# Patient Record
Sex: Female | Born: 1960 | Race: White | Hispanic: No | Smoking: Current every day smoker
Health system: Southern US, Community
[De-identification: ages and names within clinical notes are randomized; demographics above are authoritative.]

## PROBLEM LIST (undated history)

## (undated) DIAGNOSIS — F419 Anxiety disorder, unspecified: Secondary | ICD-10-CM

## (undated) DIAGNOSIS — F32A Depression, unspecified: Secondary | ICD-10-CM

## (undated) DIAGNOSIS — F329 Major depressive disorder, single episode, unspecified: Secondary | ICD-10-CM

---

## 2005-11-28 ENCOUNTER — Ambulatory Visit: Payer: Self-pay | Admitting: General Surgery

## 2006-04-11 ENCOUNTER — Ambulatory Visit: Payer: Self-pay | Admitting: General Surgery

## 2006-07-14 ENCOUNTER — Ambulatory Visit: Payer: Self-pay | Admitting: General Surgery

## 2006-11-10 ENCOUNTER — Ambulatory Visit: Payer: Self-pay | Admitting: General Surgery

## 2007-11-26 ENCOUNTER — Ambulatory Visit: Payer: Self-pay | Admitting: General Surgery

## 2008-11-30 ENCOUNTER — Ambulatory Visit: Payer: Self-pay | Admitting: Family Medicine

## 2009-09-24 ENCOUNTER — Emergency Department: Payer: Self-pay | Admitting: Emergency Medicine

## 2009-12-12 ENCOUNTER — Ambulatory Visit: Payer: Self-pay | Admitting: General Surgery

## 2010-12-09 HISTORY — PX: BREAST BIOPSY: SHX20

## 2010-12-13 ENCOUNTER — Ambulatory Visit: Payer: Self-pay | Admitting: General Surgery

## 2010-12-20 ENCOUNTER — Ambulatory Visit: Payer: Self-pay | Admitting: General Surgery

## 2010-12-31 ENCOUNTER — Ambulatory Visit: Payer: Self-pay | Admitting: General Surgery

## 2011-06-19 ENCOUNTER — Ambulatory Visit: Payer: Self-pay | Admitting: General Surgery

## 2011-12-16 ENCOUNTER — Ambulatory Visit: Payer: Self-pay | Admitting: General Surgery

## 2013-05-05 ENCOUNTER — Ambulatory Visit: Payer: Self-pay | Admitting: Family Medicine

## 2013-05-07 ENCOUNTER — Ambulatory Visit: Payer: Self-pay | Admitting: Family Medicine

## 2013-10-03 ENCOUNTER — Emergency Department: Payer: Self-pay | Admitting: Emergency Medicine

## 2013-11-19 ENCOUNTER — Ambulatory Visit: Payer: Self-pay | Admitting: Family Medicine

## 2014-02-14 IMAGING — US ULTRASOUND RIGHT BREAST
1 series · 8 of 8 positions shown · non-contrast
Comparison: 05/05/2013, 05/07/2013, 12/16/2011, 12/13/2010,
12/12/2009, 11/30/2008

CLINICAL DATA: Six-month re-evaluation of right breast asymmetry
and right breast cysts

EXAM:
DIGITAL DIAGNOSTIC  RIGHT MAMMOGRAM WITH CAD
ULTRASOUND RIGHT BREAST

[Series 1: ultrasound right breast · 0.08mm/px · 8 of 8 slices shown]
[im 1/8]
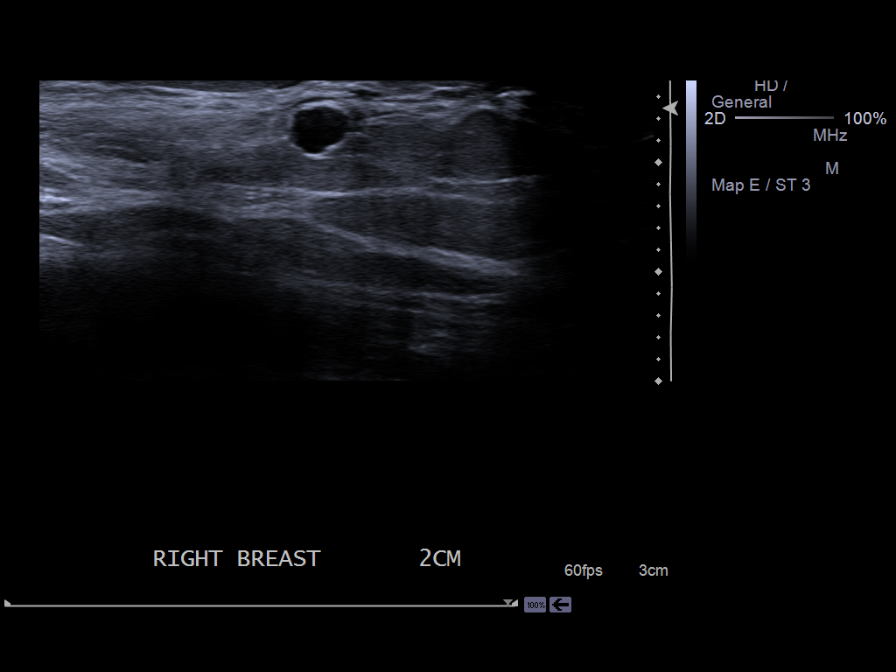
[im 2/8]
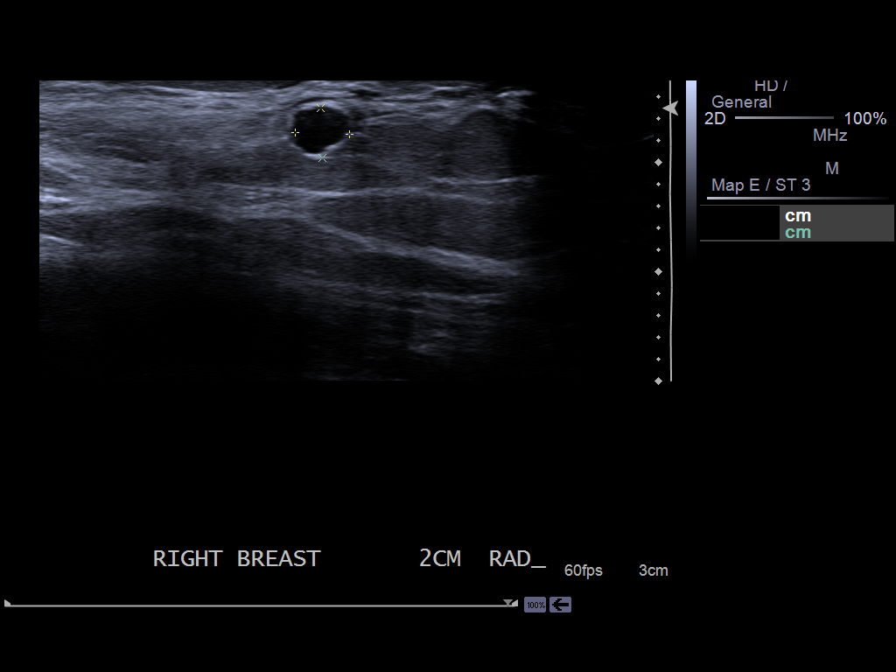
[im 3/8]
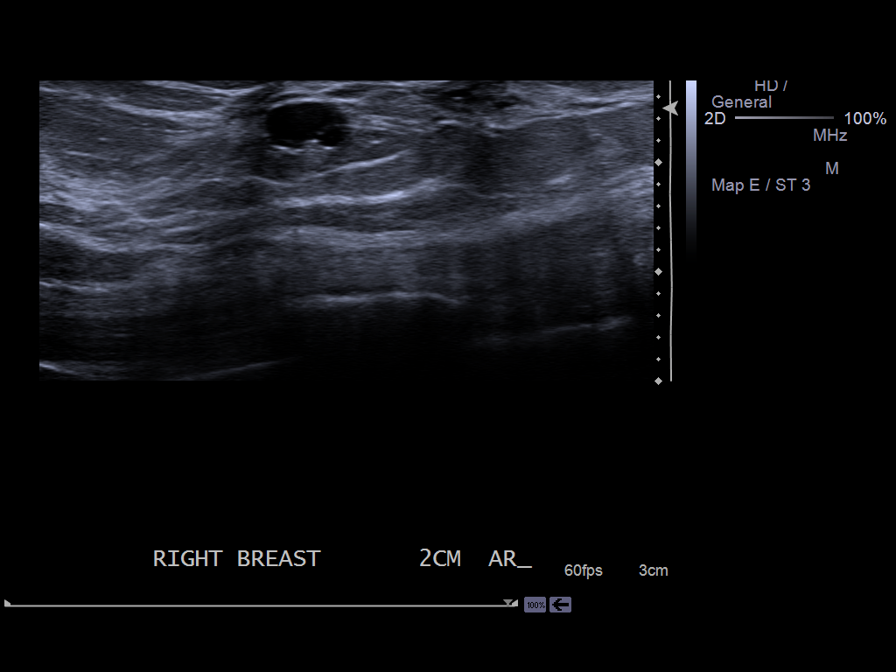
[im 4/8]
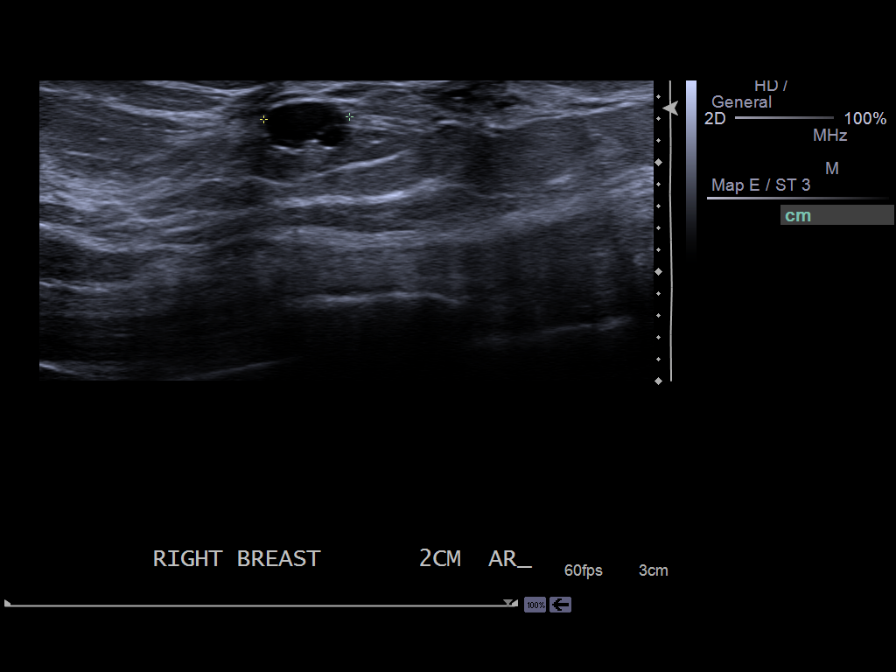
[im 5/8]
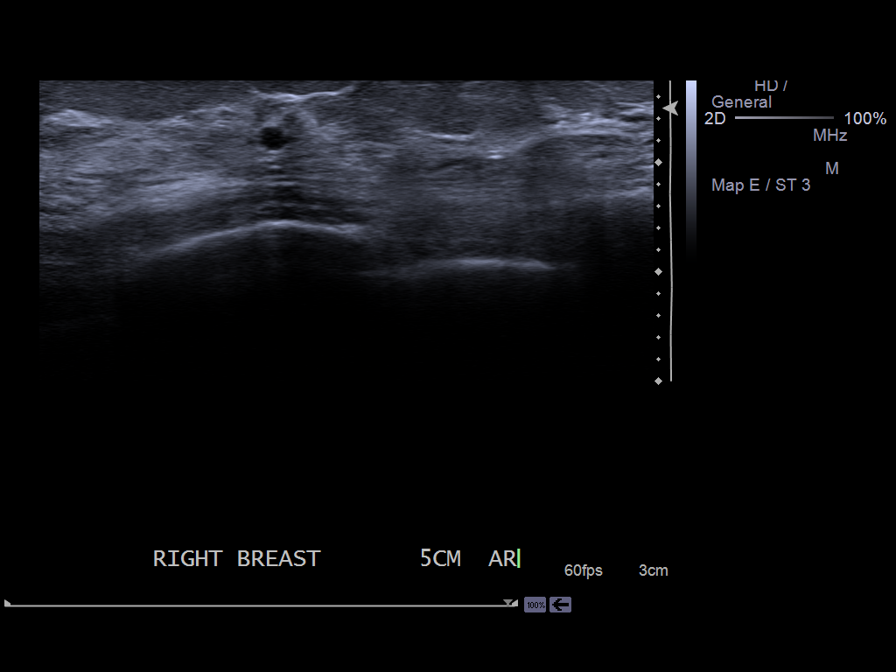
[im 6/8]
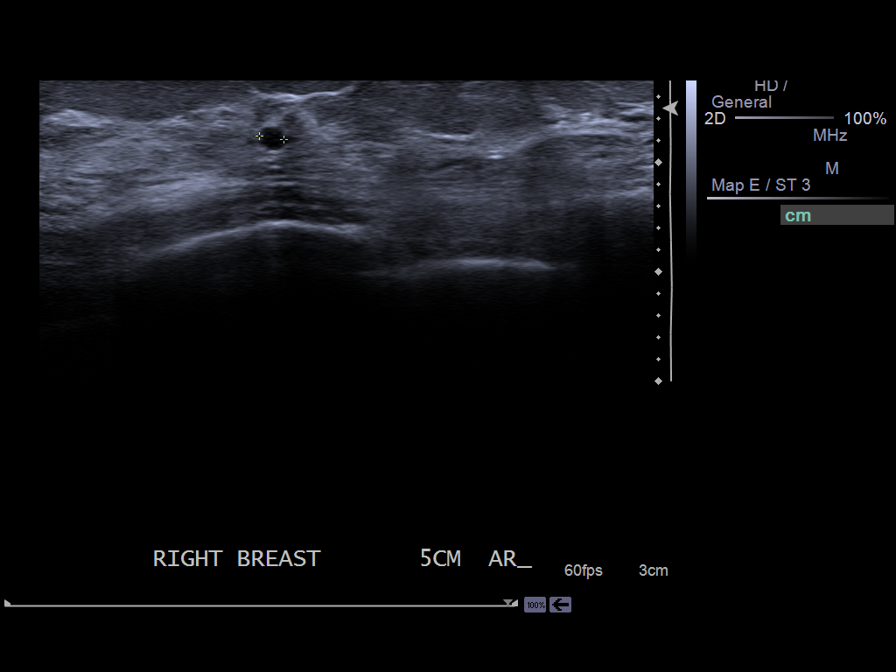
[im 7/8]
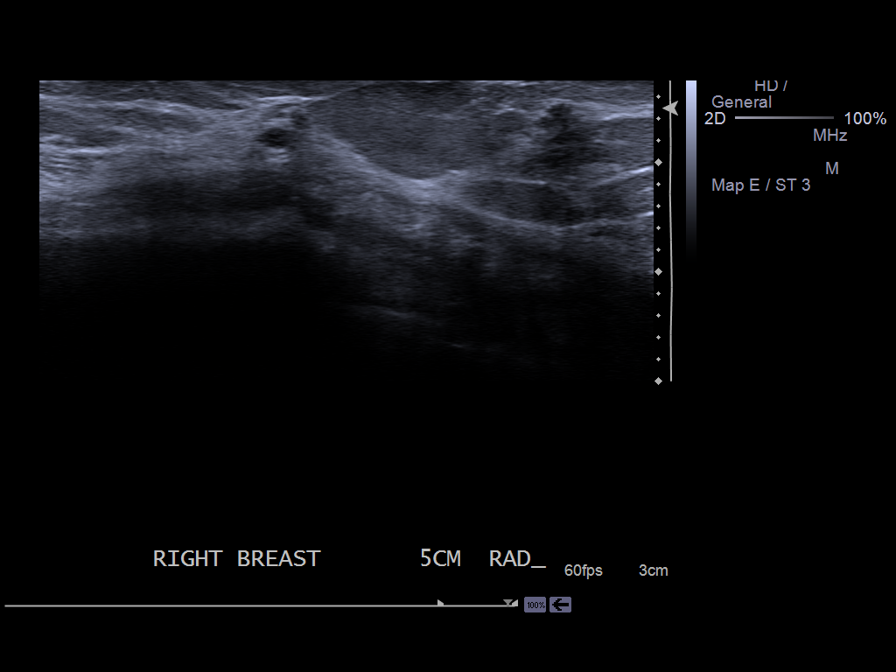
[im 8/8]
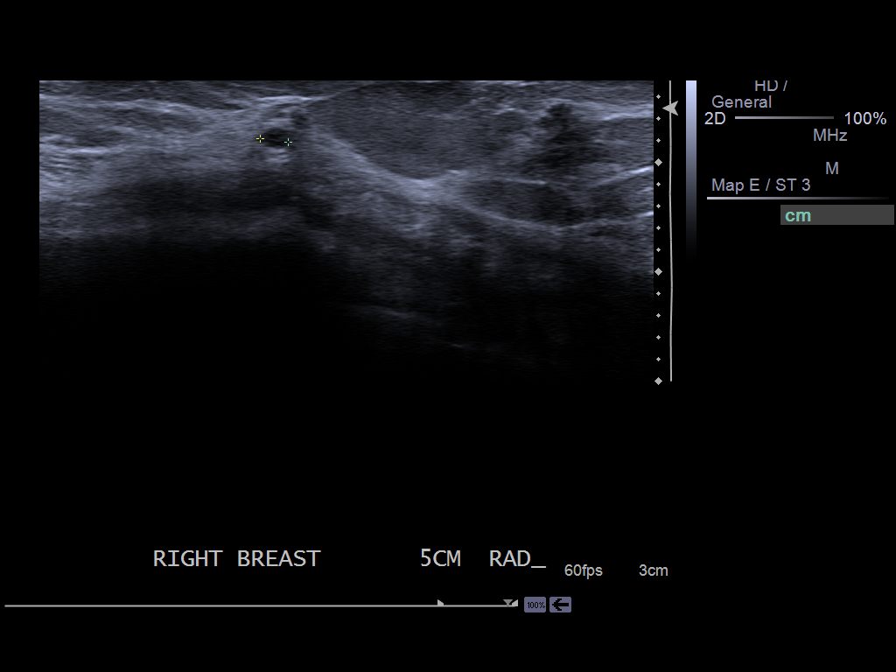

[8 of 8 positions shown; findings below may reference images not displayed]

ACR Breast Density Category b: There are scattered areas of
fibroglandular density.
FINDINGS: There are no mammographic abnormalities. The previously questioned
mammographic asymmetry in the posterior central right breast seen on
the MLO view is stable on all prior studies and represents normal
glandular tissue.

Mammographic images were processed with CAD.

On physical exam,there are no palpable abnormalities..

Ultrasound is performed, showing a mildly complicated cyst in the 9
o'clock position of the right breast 2 cm from the nipple. It
measures 5 x 5 x 8 mm. There is also a minimally complicated cyst in
the 8 o'clock position 5 cm from the nipple. It measures 3 mm in
diameter..
IMPRESSION: The mammogram is normal. There are 2 mildly complicated cysts seen
by ultrasound which are stable from the prior study April 2013.

RECOMMENDATION:
Diagnostic bilateral mammogram with right ultrasound in 6 months

I have discussed the findings and recommendations with the patient.
Results were also provided in writing at the conclusion of the
visit.

BI-RADS CATEGORY  3: Probably benign finding(s) - short interval
follow-up suggested.

## 2016-03-19 ENCOUNTER — Encounter: Payer: Self-pay | Admitting: Emergency Medicine

## 2016-03-19 ENCOUNTER — Emergency Department
Admission: EM | Admit: 2016-03-19 | Discharge: 2016-03-19 | Disposition: A | Discharge status: Home / Self Care | Payer: BC Managed Care – PPO | Attending: Emergency Medicine | Admitting: Emergency Medicine

## 2016-03-19 ENCOUNTER — Emergency Department: Payer: BC Managed Care – PPO

## 2016-03-19 DIAGNOSIS — R413 Other amnesia: Secondary | ICD-10-CM | POA: Diagnosis present

## 2016-03-19 DIAGNOSIS — F4325 Adjustment disorder with mixed disturbance of emotions and conduct: Secondary | ICD-10-CM

## 2016-03-19 DIAGNOSIS — F321 Major depressive disorder, single episode, moderate: Secondary | ICD-10-CM

## 2016-03-19 DIAGNOSIS — F419 Anxiety disorder, unspecified: Secondary | ICD-10-CM | POA: Diagnosis not present

## 2016-03-19 DIAGNOSIS — F17200 Nicotine dependence, unspecified, uncomplicated: Secondary | ICD-10-CM | POA: Insufficient documentation

## 2016-03-19 DIAGNOSIS — F329 Major depressive disorder, single episode, unspecified: Secondary | ICD-10-CM | POA: Insufficient documentation

## 2016-03-19 DIAGNOSIS — I10 Essential (primary) hypertension: Secondary | ICD-10-CM | POA: Insufficient documentation

## 2016-03-19 DIAGNOSIS — F32A Depression, unspecified: Secondary | ICD-10-CM

## 2016-03-19 HISTORY — DX: Major depressive disorder, single episode, unspecified: F32.9

## 2016-03-19 HISTORY — DX: Anxiety disorder, unspecified: F41.9

## 2016-03-19 HISTORY — DX: Depression, unspecified: F32.A

## 2016-03-19 LAB — URINALYSIS COMPLETE WITH MICROSCOPIC (ARMC ONLY)
BACTERIA UA: NONE SEEN
Bilirubin Urine: NEGATIVE
Glucose, UA: NEGATIVE mg/dL
Hgb urine dipstick: NEGATIVE
Leukocytes, UA: NEGATIVE
Nitrite: NEGATIVE
PH: 6 (ref 5.0–8.0)
PROTEIN: NEGATIVE mg/dL
Specific Gravity, Urine: 1.012 (ref 1.005–1.030)

## 2016-03-19 LAB — COMPREHENSIVE METABOLIC PANEL
ALBUMIN: 4.8 g/dL (ref 3.5–5.0)
ALK PHOS: 123 U/L (ref 38–126)
ALT: 17 U/L (ref 14–54)
ANION GAP: 7 (ref 5–15)
AST: 25 U/L (ref 15–41)
BUN: 13 mg/dL (ref 6–20)
CHLORIDE: 104 mmol/L (ref 101–111)
CO2: 27 mmol/L (ref 22–32)
Calcium: 10 mg/dL (ref 8.9–10.3)
Creatinine, Ser: 0.8 mg/dL (ref 0.44–1.00)
GFR calc non Af Amer: >60 mL/min (ref >60)
Glucose, Bld: 100 mg/dL — ABNORMAL HIGH (ref 65–99)
POTASSIUM: 3.2 mmol/L — AB (ref 3.5–5.1)
SODIUM: 138 mmol/L (ref 135–145)
Total Bilirubin: 0.8 mg/dL (ref 0.3–1.2)
Total Protein: 8.6 g/dL — ABNORMAL HIGH (ref 6.5–8.1)

## 2016-03-19 LAB — CBC
HCT: 44.5 % (ref 35.0–47.0)
HEMOGLOBIN: 15 g/dL (ref 12.0–16.0)
MCH: 29.6 pg (ref 26.0–34.0)
MCHC: 33.6 g/dL (ref 32.0–36.0)
MCV: 87.9 fL (ref 80.0–100.0)
Platelets: 236 10*3/uL (ref 150–440)
RBC: 5.07 MIL/uL (ref 3.80–5.20)
RDW: 13.4 % (ref 11.5–14.5)
WBC: 9.5 10*3/uL (ref 3.6–11.0)

## 2016-03-19 MED ORDER — ONDANSETRON 4 MG PO TBDP
4.0000 mg | ORAL_TABLET | Freq: Once | ORAL | Status: AC
  Administered 2016-03-19: 4 mg via ORAL
  Filled 2016-03-19: qty 1

## 2016-03-19 NOTE — ED Notes (Signed)
Pt reports she has been having issues with her memory and small skills over past couple weeks. Pt was seen by PCP yesterday and placed on namenda, pt concerned bc she couldn't get appt with neuro until may 16th.

## 2016-03-19 NOTE — ED Provider Notes (Signed)
[facility] Emergency Department Provider Note  ____________________________________________  Time seen: Approximately 12:46 PM  I have reviewed the triage vital signs and the nursing notes.   HISTORY  Chief Complaint Memory Loss    HPI Alicia Norton is a 55 y.o. female with no significant reported past medical history who presents by private vehicle with chief complaint of worsening memory and cognitive deficits.  She presents with a friend with whom she works.  The friend states that she and the patient's other coworkers noticed for about a year now little things from time to time to indicate that the patient is having some memory difficulties.  However she said it has been markedly worse acutely over the last 3 or 4 days, definitely less than a week.  The patient forgets how to do simple tasks or tasks that she has been doing for years, such as using the copy machine or phone system.  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  As described above, the acute worsening of the symptoms has made them severe and nothing is making it better or worse.  She was taken to her PCP yesterday and started on Namenda and referred to neurology, but her appointment will not be for another 5 weeks.  She is tearful and extremely worried about what this represents.  He denies generalized weakness, headache, visual changes, difficulty swallowing, difficulty speaking, chest pain, shortness of breath, nausea, vomiting, diarrhea, dysuria, difficulty with ambulation, and any weakness or numbness in any particular extremity.  The patient does have a history of depression and anxiety.  She reports that her oldest son has been living with her recently and that he is "not a nice person", very mean and angry, and that  she has been under a great deal of increased stress as a result of the living situation.   Past Medical History  Diagnosis Date  . Depression   . Anxiety     Patient Active Problem List   Diagnosis Date Noted  . 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 Moderate major depression (HCC) 03/19/2016  . Hypertension 03/19/2016    History reviewed. No pertinent past surgical history.  Current Outpatient Rx  Name  Route  Sig  Dispense  Refill  . amLODipine (NORVASC) 10 MG tablet   Oral   Take 10 mg by mouth daily.         . clonazePAM (KLONOPIN) 0.5 MG tablet   Oral   Take 0.5 mg by mouth daily as needed for anxiety.         . meloxicam (MOBIC) 15 MG tablet   Oral   Take 15 mg by mouth daily.         Marland Kitchen PARoxetine (PAXIL) 20 MG tablet   Oral   Take 20 mg by mouth daily.         Marland Kitchen triamcinolone cream (KENALOG) 0.1 %   Topical   Apply 1 application topically 2 (two) times daily as needed (for itching).           Allergies Review of patient's allergies indicates no known allergies.  History reviewed. No pertinent family history.  Social History Social History  Substance Use Topics  . Smoking status: Current Every Day Smoker  . Smokeless tobacco: None  .  Alcohol Use: No    Review of Systems Constitutional: No fever/chills Eyes: No visual changes. ENT: No sore throat. Cardiovascular: Denies chest pain. Respiratory: Denies shortness of breath. Gastrointestinal: No abdominal pain.  No nausea, no vomiting.  No diarrhea.  No constipation. Genitourinary: Negative for dysuria. Musculoskeletal: Negative for back pain. Skin: Negative for rash. Neurological: Negative for headaches, focal weakness or numbness.  Difficulty remembering things including both details of appointments or events as well as how to perform simple tasks or even write her own name.  10-point ROS otherwise  negative.  ____________________________________________   PHYSICAL EXAM:  VITAL SIGNS: ED Triage Vitals  Enc Vitals Group     BP 03/19/16 1214 180/119 mmHg     Pulse Rate 03/19/16 1214 68     Resp 03/19/16 1214 18     Temp 03/19/16 1214 98.7 F (37.1 C)     Temp Source 03/19/16 1214 Oral     SpO2 03/19/16 1214 98 %     Weight 03/19/16 1214 120 lb (54.432 kg)     Height 03/19/16 1214 5\' 6"  (1.676 m)     Head Cir --      Peak Flow --      Pain Score --      Pain Loc --      Pain Edu? --      Excl. in GC? --     Constitutional: Alert and oriented. Appears older than chronological age.  Tearful and upset. Eyes: Conjunctivae are normal. PERRL. EOMI. Head: Atraumatic. Nose: No congestion/rhinnorhea. Mouth/Throat: Mucous membranes are moist.  Oropharynx non-erythematous. Neck: No stridor.  No meningeal signs.   Cardiovascular: Normal rate, regular rhythm. Good peripheral circulation. Grossly normal heart sounds.   Respiratory: Normal respiratory effort.  No retractions. Lungs CTAB. Gastrointestinal: Soft and nontender. No distention.  Musculoskeletal: No lower extremity tenderness nor edema. No gross deformities of extremities. Neurologic:  Normal speech and language. No gross focal neurologic deficits are appreciated.  Skin:  Skin is warm, dry and intact. No rash noted. Psychiatric: Mood and affect are tearful, upset, emotional, but generally appropriate for the circumstances.  No SI/HI  ____________________________________________   LABS (all labs ordered are listed, but only abnormal results are displayed)  Labs Reviewed  COMPREHENSIVE METABOLIC PANEL - Abnormal; Notable for the following:    Potassium 3.2 (*)    Glucose, Bld 100 (*)    Total Protein 8.6 (*)    All other components within normal limits  URINALYSIS COMPLETEWITH MICROSCOPIC ([facility] ONLY) - Abnormal; Notable for the following:    Color, Urine YELLOW (*)    APPearance CLEAR (*)    Ketones, ur TRACE (*)     Squamous Epithelial / LPF 0-5 (*)    All other components within normal limits  CBC  CBG MONITORING, ED   ____________________________________________  EKG  ED ECG REPORT I, Janaysha Depaulo, the attending physician, personally viewed and interpreted this ECG.   Date: 03/19/2016  EKG Time: 13:44  Rate: 68  Rhythm: normal sinus rhythm  Axis: Normal  Intervals:Normal  ST&T Change: Non-specific ST segment / T-wave changes, but no evidence of acute ischemia.   ____________________________________________  RADIOLOGY   Ct Head Wo Contrast  03/19/2016  CLINICAL DATA:  Worsening memory and cognitive deficits. EXAM: CT HEAD WITHOUT CONTRAST TECHNIQUE: Contiguous axial images were obtained from the base of the skull through the vertex without intravenous contrast. COMPARISON:  None. FINDINGS: Bony calvarium appears intact. No mass effect or midline shift is noted. Ventricular  size is within normal limits. There is no evidence of mass lesion, hemorrhage or acute infarction. IMPRESSION: Normal head CT. Electronically Signed   By: Lupita Raider, M.D.   On: 03/19/2016 13:56    ____________________________________________   PROCEDURES  Procedure(s) performed: None  Critical Care performed: No ____________________________________________   INITIAL IMPRESSION / ASSESSMENT AND PLAN / ED COURSE  Pertinent labs & imaging results that were available during my care of the patient were reviewed by me and considered in my medical decision making (see chart for details).  I will proceed with lab work and urinalysis as well as a screening head CT noncontrast to evaluate for any possible lesion or mass effect or even evidence of a recent stroke.  The patient has no current focal neurological deficits and I do not think an emergent MRI would be beneficial.    I discussed with the patient and her friend they have possible benefit of speaking with psychiatry all we are also evaluating for  possible organic causes.  She was amenable to this.  I called by phone and spoke with Dr. Toni Amend and discussed the case with him.  He agreed to come to the emergency department and personally evaluated the patient as well.    ----------------------------------------- 4:20 PM on 03/19/2016 -----------------------------------------  Dr. Toni Amend evaluated the patient in the emergency department and discussed the case with me in person.  He strongly feels that the patient does not have any sort of early-onset dementia and that the symptoms are all the result of anxiety and depression.  She let him know that she has not been taking her Paxil or Klonopin for a long period of time.  He counseled her to continue taking her medications as prescribed and to follow up with her primary care doctor as well as with any possible employee assistance program.  He encouraged her to stop taking the Namenda.  I discussed all this with her as well and reiterated the need for outpatient follow-up.  She is very comfortable with this plan. ____________________________________________  FINAL CLINICAL IMPRESSION(S) / ED DIAGNOSES  Final diagnoses:  Anxiety  Depression      NEW MEDICATIONS STARTED DURING THIS VISIT:  New Prescriptions   No medications on file      Note:  This document was prepared using Dragon voice recognition software and may include unintentional dictation errors.   Loleta Rose, MD 03/19/16 [phone]

## 2016-03-19 NOTE — ED Notes (Signed)
MD at bedside. 

## 2016-03-19 NOTE — ED Notes (Signed)
Pt is concerned about her behavior and memory issues. Pt counseled that she is not herself - "one day I am fine and the next day I am not." She is also concerned that her PCP just gave her medication and didn't do any tests. She has neuro appt May 16 but is very anxious and doesn't feel like she can wait. Pt tearful in triage and says she has been crying for 3 days.
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
## 2016-03-19 NOTE — ED Notes (Signed)
Patient transported to CT 

## 2016-03-19 NOTE — Discharge Instructions (Signed)
As we discussed and you discussed with Dr. Toni Amend, he strongly feels that her symptoms are the result of anxiety and depression, not early-onset dementia.  He recommended that you stop taking the Namenda and follow-up with the employee assistance program at your place of employment.  I also provided information for [facility] which is a psychiatric health care Center that has office hours and a crisis hotline number.  If your symptoms worsen, if he develop any thoughts of hurting herself or anyone else, or you develop any other emergent medical condition, please return immediately to the emergency department.   Major Depressive Disorder Major depressive disorder is a mental illness. It also may be called clinical depression or unipolar depression. Major depressive disorder usually causes feelings of sadness, hopelessness, or helplessness. Some people with this disorder do not feel particularly sad but lose interest in doing things they used to enjoy (anhedonia). Major depressive disorder also can cause physical symptoms. It can interfere with work, school, relationships, and other normal everyday activities. The disorder varies in severity but is longer lasting and more serious than the sadness we all feel from time to time in our lives. Major depressive disorder often is triggered by stressful life events or major life changes. Examples of these triggers include divorce, loss of your job or home, a move, and the death of a family member or close friend. Sometimes this disorder occurs for no obvious reason at all. People who have family members with major depressive disorder or bipolar disorder are at higher risk for developing this disorder, with or without life stressors. Major depressive disorder can occur at any age. It may occur just once in your life (single episode major depressive disorder). It may occur multiple times (recurrent major depressive disorder). SYMPTOMS People with major depressive disorder  have either anhedonia or depressed mood on nearly a daily basis for at least 2 weeks or longer. Symptoms of depressed mood include:  Feelings of sadness (blue or down in the dumps) or emptiness.  Feelings of hopelessness or helplessness.  Tearfulness or episodes of crying (may be observed by others).  Irritability (children and adolescents). In addition to depressed mood or anhedonia or both, people with this disorder have at least four of the following symptoms:  Difficulty sleeping or sleeping too much.   Significant change (increase or decrease) in appetite or weight.   Lack of energy or motivation.  Feelings of guilt and worthlessness.   Difficulty concentrating, remembering, or making decisions.  Unusually slow movement (psychomotor retardation) or restlessness (as observed by others).   Recurrent wishes for death, recurrent thoughts of self-harm (suicide), or a suicide attempt. People with major depressive disorder commonly have persistent negative thoughts about themselves, other people, and the world. People with severe major depressive disorder may experiencedistorted beliefs or perceptions about the world (psychotic delusions). They also may see or hear things that are not real (psychotic hallucinations). DIAGNOSIS Major depressive disorder is diagnosed through an assessment by your health care provider. Your health care provider will ask aboutaspects of your daily life, such as mood,sleep, and appetite, to see if you have the diagnostic symptoms of major depressive disorder. Your health care provider may ask about your medical history and use of alcohol or drugs, including prescription medicines. Your health care provider also may do a physical exam and blood work. This is because certain medical conditions and the use of certain substances can cause major depressive disorder-like symptoms (secondary depression). Your health care provider also may refer  you to a mental  health specialist for further evaluation and treatment. TREATMENT It is important to recognize the symptoms of major depressive disorder and seek treatment. The following treatments can be prescribed for this disorder:   Medicine. Antidepressant medicines usually are prescribed. Antidepressant medicines are thought to correct chemical imbalances in the brain that are commonly associated with major depressive disorder. Other types of medicine may be added if the symptoms do not respond to antidepressant medicines alone or if psychotic delusions or hallucinations occur.  Talk therapy. Talk therapy can be helpful in treating major depressive disorder by providing support, education, and guidance. Certain types of talk therapy also can help with negative thinking (cognitive behavioral therapy) and with relationship issues that trigger this disorder (interpersonal therapy). A mental health specialist can help determine which treatment is best for you. Most people with major depressive disorder do well with a combination of medicine and talk therapy. Treatments involving electrical stimulation of the brain can be used in situations with extremely severe symptoms or when medicine and talk therapy do not work over time. These treatments include electroconvulsive therapy, transcranial magnetic stimulation, and vagal nerve stimulation.   This information is not intended to replace advice given to you by your health care provider. Make sure you discuss any questions you have with your health care provider.   Document Released: 03/22/2013 Document Revised: 12/16/2014 Document Reviewed: 03/22/2013 Elsevier Interactive Patient Education 2016 [employer] Inc.  Generalized Anxiety Disorder Generalized anxiety disorder (GAD) is a mental disorder. It interferes with life functions, including relationships, work, and school. GAD is different from normal anxiety, which everyone experiences at some point in their lives in  response to specific life events and activities. Normal anxiety actually helps [place] prepare for and get through these life events and activities. Normal anxiety goes away after the event or activity is over.  GAD causes anxiety that is not necessarily related to specific events or activities. It also causes excess anxiety in proportion to specific events or activities. The anxiety associated with GAD is also difficult to control. GAD can vary from mild to severe. People with severe GAD can have intense waves of anxiety with physical symptoms (panic attacks).  SYMPTOMS The anxiety and worry associated with GAD are difficult to control. This anxiety and worry are related to many life events and activities and also occur more days than not for 6 months or longer. People with GAD also have three or more of the following symptoms (one or more in children):  Restlessness.   Fatigue.  Difficulty concentrating.   Irritability.  Muscle tension.  Difficulty sleeping or unsatisfying sleep. DIAGNOSIS GAD is diagnosed through an assessment by your health care provider. Your health care provider will ask you questions aboutyour mood,physical symptoms, and events in your life. Your health care provider may ask you about your medical history and use of alcohol or drugs, including prescription medicines. Your health care provider may also do a physical exam and blood tests. Certain medical conditions and the use of certain substances can cause symptoms similar to those associated with GAD. Your health care provider may refer you to a mental health specialist for further evaluation. TREATMENT The following therapies are usually used to treat GAD:   Medication. Antidepressant medication usually is prescribed for long-term daily control. Antianxiety medicines may be added in severe cases, especially when panic attacks occur.   Talk therapy (psychotherapy). Certain types of talk therapy can be helpful in  treating GAD by providing support,  education, and guidance. A form of talk therapy called cognitive behavioral therapy can teach you healthy ways to think about and react to daily life events and activities.  Stress managementtechniques. These include yoga, meditation, and exercise and can be very helpful when they are practiced regularly. A mental health specialist can help determine which treatment is best for you. Some people see improvement with one therapy. However, other people require a combination of therapies.   This information is not intended to replace advice given to you by your health care provider. Make sure you discuss any questions you have with your health care provider.   Document Released: 03/22/2013 Document Revised: 12/16/2014 Document Reviewed: 03/22/2013 Elsevier Interactive Patient Education [employer]2016 Else[employer] Inc.

## 2016-04-23 ENCOUNTER — Other Ambulatory Visit: Payer: Self-pay | Admitting: Family Medicine

## 2016-04-23 DIAGNOSIS — R928 Other abnormal and inconclusive findings on diagnostic imaging of breast: Secondary | ICD-10-CM

## 2016-04-23 DIAGNOSIS — Z1231 Encounter for screening mammogram for malignant neoplasm of breast: Secondary | ICD-10-CM

## 2016-05-10 ENCOUNTER — Inpatient Hospital Stay: Admission: RE | Admit: 2016-05-10 | Payer: BC Managed Care – PPO | Source: Ambulatory Visit

## 2016-05-10 ENCOUNTER — Ambulatory Visit: Payer: BC Managed Care – PPO | Attending: Family Medicine

## 2016-05-10 ENCOUNTER — Other Ambulatory Visit: Payer: BC Managed Care – PPO

## 2016-08-28 ENCOUNTER — Ambulatory Visit
Admission: RE | Admit: 2016-08-28 | Discharge: 2016-08-28 | Disposition: A | Discharge status: Home / Self Care | Payer: BC Managed Care – PPO | Source: Ambulatory Visit | Attending: Family Medicine | Admitting: Family Medicine

## 2016-08-28 DIAGNOSIS — Z1231 Encounter for screening mammogram for malignant neoplasm of breast: Secondary | ICD-10-CM

## 2016-08-28 DIAGNOSIS — R928 Other abnormal and inconclusive findings on diagnostic imaging of breast: Secondary | ICD-10-CM

## 2017-09-10 IMAGING — US US BREAST*R* LIMITED INC AXILLA
1 series · 6 of 6 positions shown · non-contrast
Comparison: Previous exam(s).

CLINICAL DATA: Follow-up of probably benign findings in the right 8
and 9 o'clock breast from last mammogram in November 2013. Patient
did not return for the recommended six-month follow-up.

EXAM:
2D DIGITAL DIAGNOSTIC BILATERAL MAMMOGRAM WITH ADJUNCT TOMO
ULTRASOUND RIGHT BREAST

[Series 1: us breast*right* limited inc axilla · 0.04mm/px · 6 of 6 slices shown]
[im 1/6]
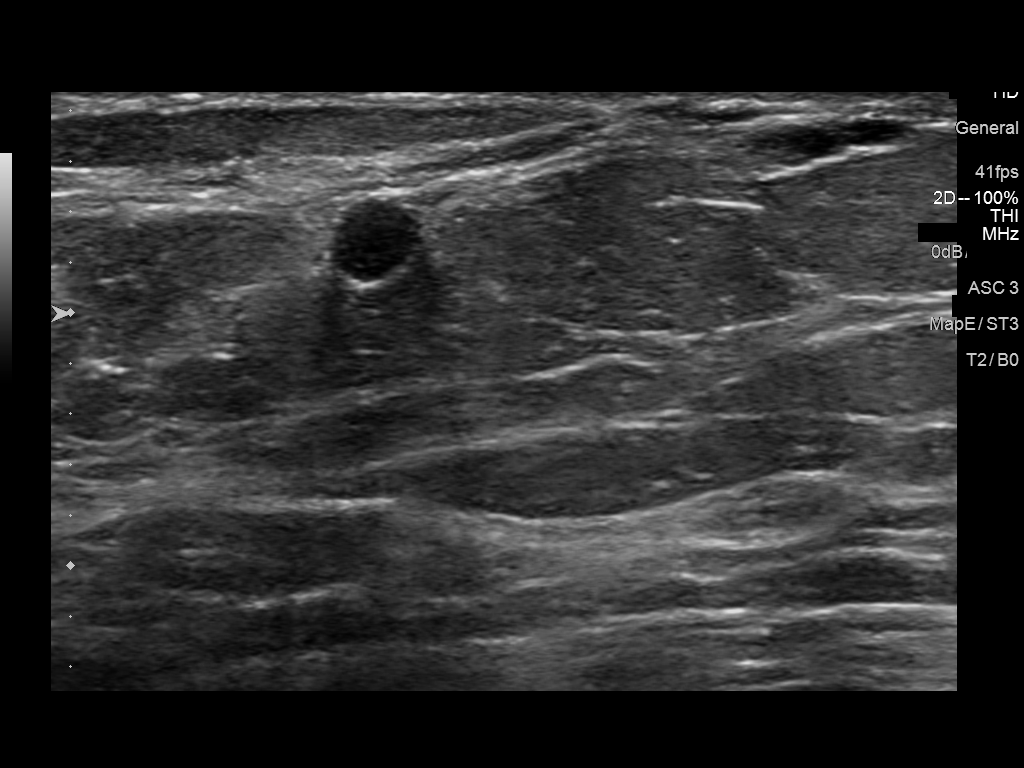
[im 2/6]
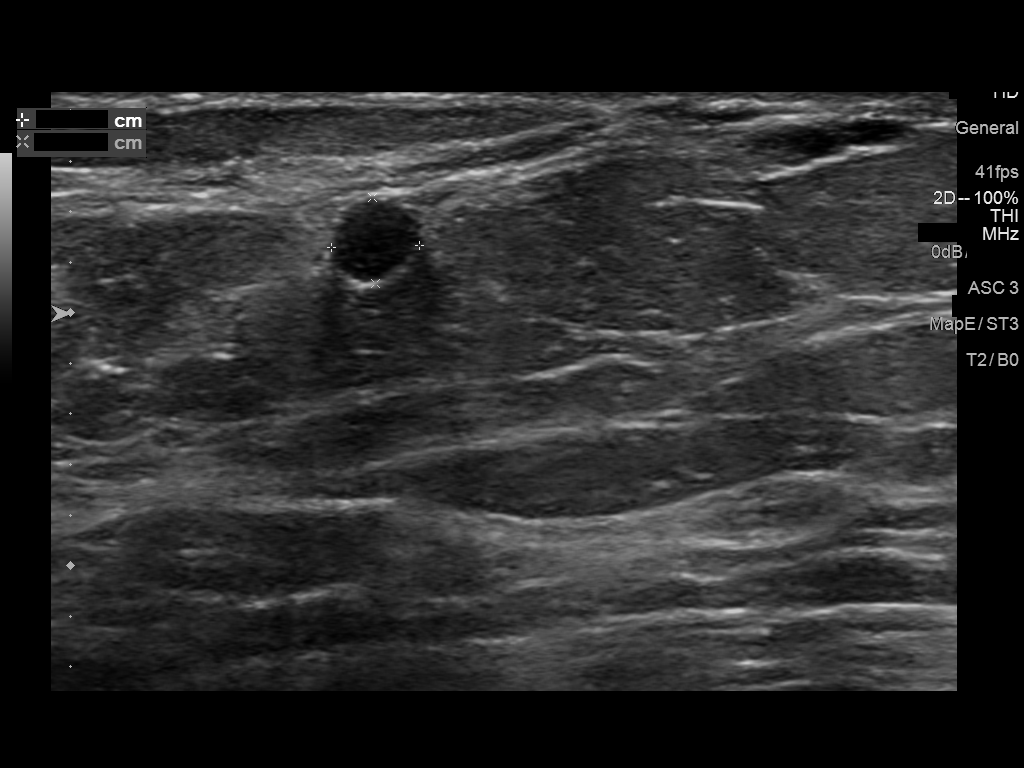
[im 3/6]
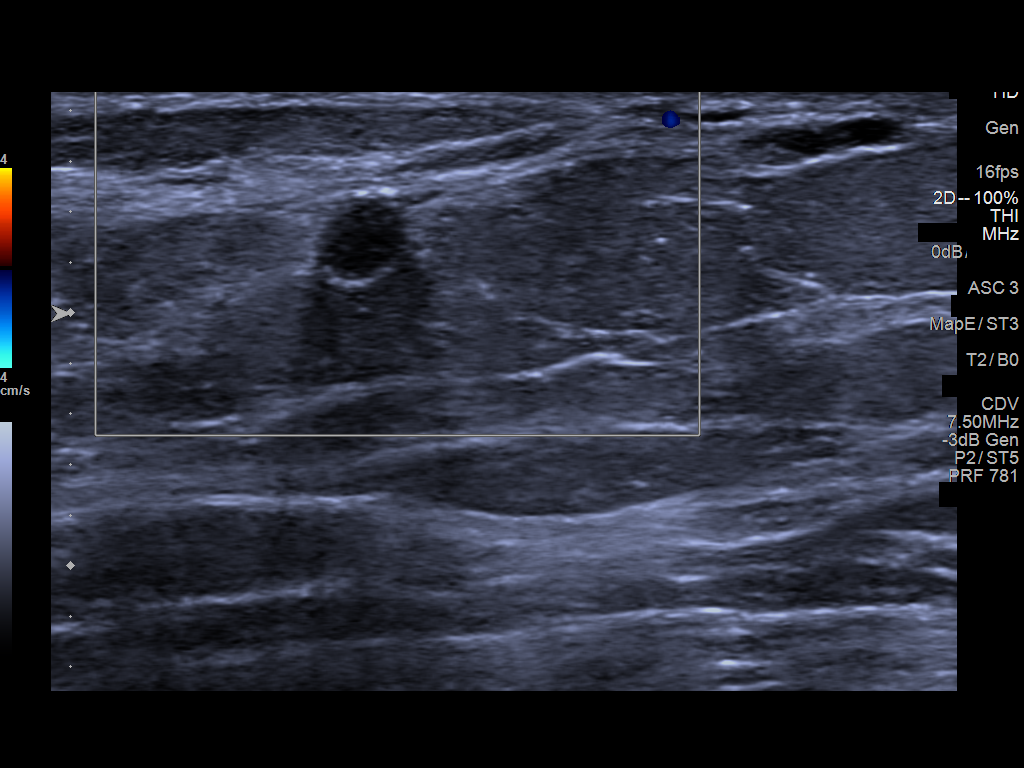
[im 4/6]
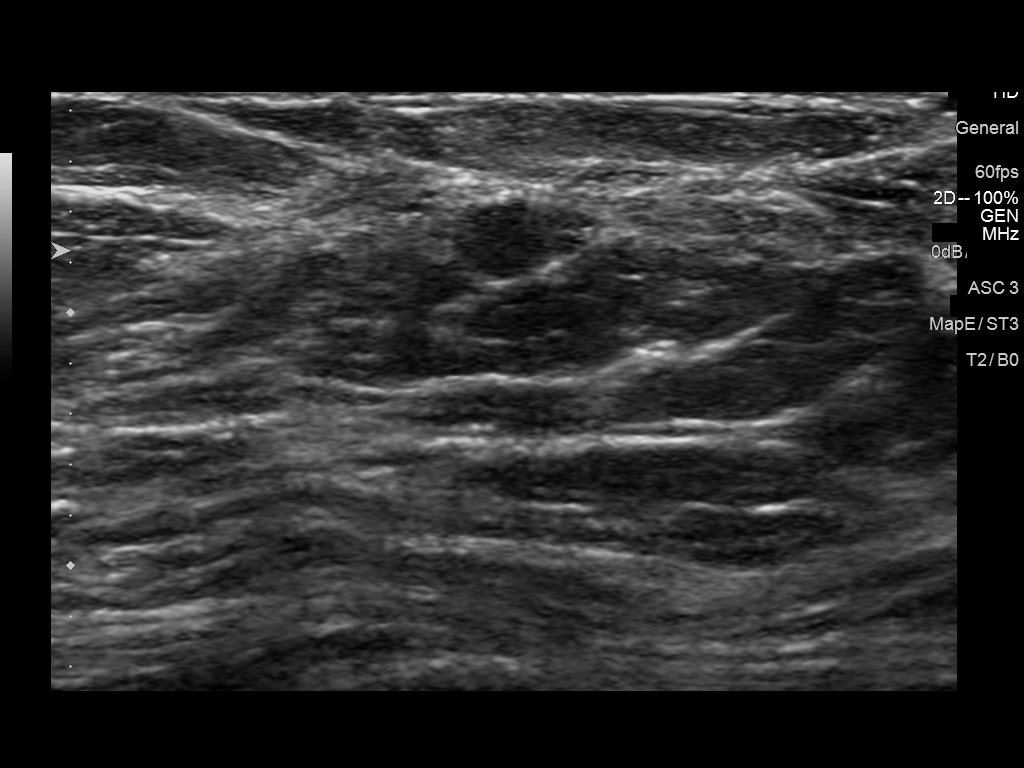
[im 5/6]
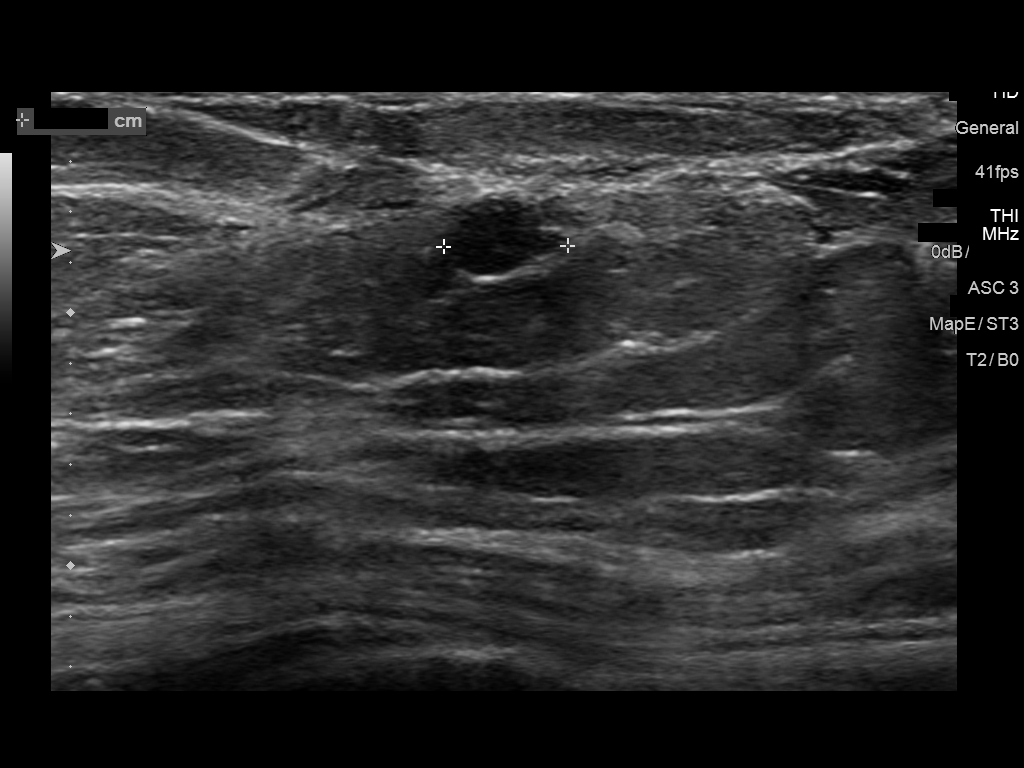
[im 6/6]
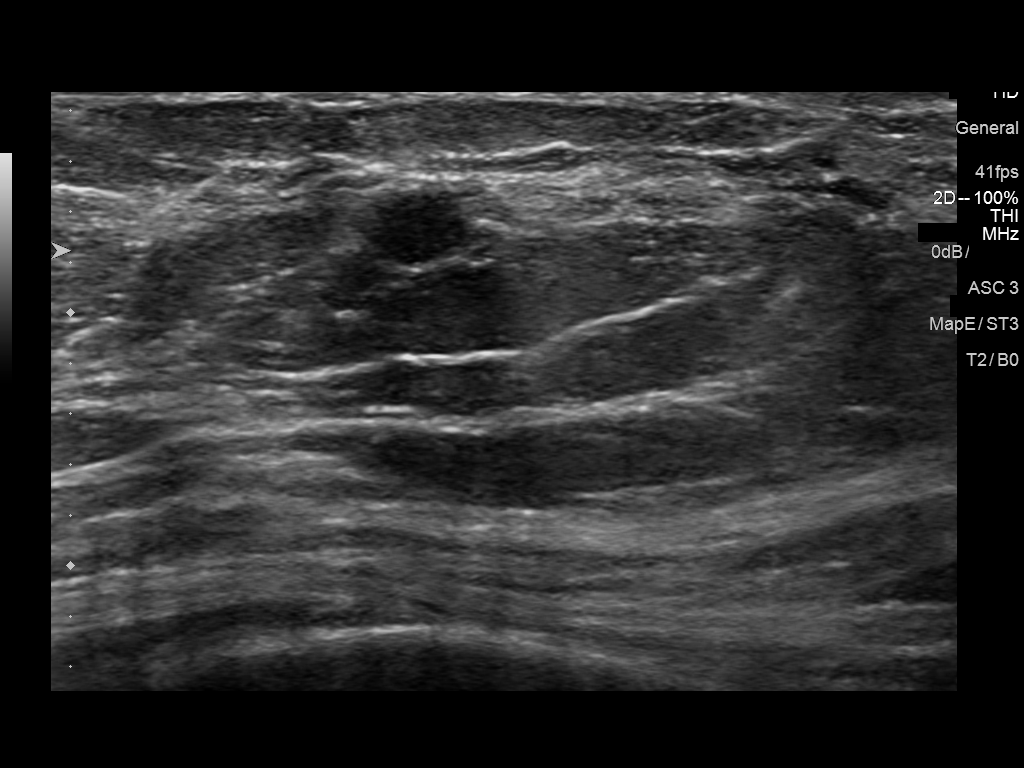

[6 of 6 positions shown; findings below may reference images not displayed]

ACR Breast Density Category b: There are scattered areas of
fibroglandular density.
FINDINGS: A mammographically, there are no suspicious masses, areas of
architectural distortion or microcalcifications in either breast.
The previously seen focal asymmetry in the right 9 o'clock breast,
anterior depth, is less prominent mammographically.

On physical exam, no suspicious masses are found.

Targeted ultrasound is performed, showing right breast 9 o'clock 2
cm from the nipple hypoechoic circumscribed benign-appearing nodule
measuring 0.5 by 0.4 x 0.3 cm. This nodule is slightly less
prominent than on the previous ultrasound dated 11/19/2013. The
previously seen right breast 8 o'clock 2 mm presumed complicated
cyst is no longer present.
IMPRESSION: Probably benign right breast 9 o'clock nodule, for which six-month
follow-up is recommended.

RECOMMENDATION:
Diagnostic mammogram and possibly ultrasound of the right breast in
6 months. (Code:[id number]-[id number])

I have discussed the findings and recommendations with the patient.
Results were also provided in writing at the conclusion of the
visit. If applicable, a reminder letter will be sent to the patient
regarding the next appointment.

BI-RADS CATEGORY  3: Probably benign.

## 2018-02-03 ENCOUNTER — Other Ambulatory Visit: Payer: Self-pay | Admitting: Neurology

## 2018-02-03 DIAGNOSIS — R4189 Other symptoms and signs involving cognitive functions and awareness: Secondary | ICD-10-CM

## 2018-02-12 ENCOUNTER — Ambulatory Visit
Admission: RE | Admit: 2018-02-12 | Discharge: 2018-02-12 | Disposition: A | Discharge status: Home / Self Care | Payer: BC Managed Care – PPO | Source: Ambulatory Visit | Attending: Neurology | Admitting: Neurology

## 2018-02-12 DIAGNOSIS — R4189 Other symptoms and signs involving cognitive functions and awareness: Secondary | ICD-10-CM | POA: Diagnosis not present

## 2023-03-26 ENCOUNTER — Other Ambulatory Visit: Payer: Self-pay

## 2023-03-26 ENCOUNTER — Emergency Department
Admission: EM | Admit: 2023-03-26 | Discharge: 2023-03-26 | Disposition: A | Discharge status: Home / Self Care | Payer: Medicare PPO | Attending: Emergency Medicine | Admitting: Emergency Medicine

## 2023-03-26 DIAGNOSIS — L03113 Cellulitis of right upper limb: Secondary | ICD-10-CM

## 2023-03-26 LAB — CBC WITH DIFFERENTIAL/PLATELET
Abs Immature Granulocytes: 0.04 10*3/uL (ref 0.00–0.07)
Basophils Absolute: 0 10*3/uL (ref 0.0–0.1)
Basophils Relative: 0 %
Eosinophils Absolute: 0 10*3/uL (ref 0.0–0.5)
Eosinophils Relative: 0 %
HCT: 41.2 % (ref 36.0–46.0)
Hemoglobin: 13.1 g/dL (ref 12.0–15.0)
Immature Granulocytes: 0 %
Lymphocytes Relative: 16 %
Lymphs Abs: 1.6 10*3/uL (ref 0.7–4.0)
MCH: 27.9 pg (ref 26.0–34.0)
MCHC: 31.8 g/dL (ref 30.0–36.0)
MCV: 87.8 fL (ref 80.0–100.0)
Monocytes Absolute: 0.9 10*3/uL (ref 0.1–1.0)
Monocytes Relative: 9 %
Neutro Abs: 7.2 10*3/uL (ref 1.7–7.7)
Neutrophils Relative %: 75 %
Platelets: 246 10*3/uL (ref 150–400)
RBC: 4.69 MIL/uL (ref 3.87–5.11)
RDW: 14 % (ref 11.5–15.5)
WBC: 9.8 10*3/uL (ref 4.0–10.5)
nRBC: 0 % (ref 0.0–0.2)

## 2023-03-26 LAB — BASIC METABOLIC PANEL
Anion gap: 9 (ref 5–15)
BUN: 19 mg/dL (ref 8–23)
CO2: 25 mmol/L (ref 22–32)
Calcium: 9.6 mg/dL (ref 8.9–10.3)
Chloride: 105 mmol/L (ref 98–111)
Creatinine, Ser: 0.63 mg/dL (ref 0.44–1.00)
GFR, Estimated: >60 mL/min (ref >60)
Glucose, Bld: 105 mg/dL — ABNORMAL HIGH (ref 70–99)
Potassium: 4.1 mmol/L (ref 3.5–5.1)
Sodium: 139 mmol/L (ref 135–145)

## 2023-03-26 LAB — LACTIC ACID, PLASMA: Lactic Acid, Venous: 1.4 mmol/L (ref 0.5–1.9)

## 2023-03-26 MED ORDER — DOXYCYCLINE HYCLATE 100 MG PO TABS: 100.0000 mg | ORAL_TABLET | Freq: Two times a day (BID) | ORAL | 0 refills | Status: DC

## 2023-03-26 MED ORDER — DOXYCYCLINE HYCLATE 100 MG PO TABS
100.0000 mg | ORAL_TABLET | Freq: Once | ORAL | Status: AC
  Administered 2023-03-26: 100 mg via ORAL
  Filled 2023-03-26: qty 1

## 2023-03-26 NOTE — ED Provider Triage Note (Signed)
Emergency Medicine Provider Triage Evaluation Note  Alicia Norton, a 62 y.o. female  was evaluated in triage.  Pt complains of right hand and upper extremity erythema and swelling.  She presents to the ED, accompanied by her adult son from [facility] healthcare facility [facility].  Patient is nonverbal at baseline.  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  Patient presents after her primary provider noted some erythema across the dorsum of the hand as well as some lymphangitis or streaking up the right upper extremity.  No reports of any fevers, nausea, vomiting, chest pain, or cough..  Review of Systems  Positive: RUE erythema and lymphangitis Negative: FCS  Physical Exam  BP 102/74 (BP Location: Left Arm)   Pulse 70   Temp 97.7 F (36.5 C) (Axillary)   Resp 18   Wt 54.4 kg   SpO2 97%   BMI 19.36 kg/m  Gen:   Awake, no distress NAD Resp:  Normal effort CTA MSK:   Moves extremities without difficulty UE hemiparesis at base Other:    Medical Decision Making  Medically screening exam initiated at 12:32 PM.  Appropriate orders placed.  Alicia Norton was informed that the remainder of the evaluation will be completed by another provider, this initial triage assessment does not replace that evaluation, and the importance of remaining in the ED until their evaluation is complete.  Patient to the ED for evaluation of RUE erythema and lymphangitis concerning for cellulitis.   Alicia Hoard, PA-C 03/26/23 1236

## 2023-03-26 NOTE — ED Triage Notes (Signed)
Son with pt. Pt from [facility]. Son states that a week ago her right hand was normal and now it is swollen, and he was not informed by the facility. Provider sent pt here for concern of cellulitis.   Son states pt is non-verbal at baseline

## 2023-03-26 NOTE — ED Provider Notes (Signed)
   [facility] Provider Note    Event Date/Time   First MD Initiated Contact with Patient 03/26/23 1307     (approximate)   History   Cellulitis   HPI  Alicia Norton is a 62 y.o. female who presents from facility for evaluation of possible skin infection.  Son noted redness to the right hand.  No fevers reported.     Physical Exam   Triage Vital Signs: ED Triage Vitals  Enc Vitals Group     BP 03/26/23 1210 102/74     Pulse Rate 03/26/23 1210 70     Resp 03/26/23 1210 18     Temp 03/26/23 1210 97.7 F (36.5 C)     Temp Source 03/26/23 1210 Axillary     SpO2 03/26/23 1210 97 %     Weight 03/26/23 1219 54.4 kg (119 lb 14.9 oz)     Height --      Head Circumference --      Peak Flow --      Pain Score 03/26/23 1338 0     Pain Loc --      Pain Edu? --      Excl. in GC? --     Most recent vital signs: Vitals:   03/26/23 1210  BP: 102/74  Pulse: 70  Resp: 18  Temp: 97.7 F (36.5 C)  SpO2: 97%     General: Awake,  CV:  Good peripheral perfusion.  Resp:  Normal effort.  Abd:  No distention.  Other:  Right hand, mild erythema with some streaking to the forearm, no fluctuance or drainable collection   ED Results / Procedures / Treatments   Labs (all labs ordered are listed, but only abnormal results are displayed) Labs Reviewed  BASIC METABOLIC PANEL - Abnormal; Notable for the following components:      Result Value   Glucose, Bld 105 (*)    All other components within normal limits  CBC WITH DIFFERENTIAL/PLATELET  LACTIC ACID, PLASMA  LACTIC ACID, PLASMA     EKG     RADIOLOGY     PROCEDURES:  Critical Care performed:  Procedures   MEDICATIONS ORDERED IN ED: Medications  doxycycline (VIBRA-TABS) tablet 100 mg (100 mg Oral Given 03/26/23 1336)     IMPRESSION / MDM / ASSESSMENT AND PLAN / ED COURSE  I reviewed the triage vital signs and the nursing notes. Patient's presentation is most consistent with  acute illness / injury with system symptoms.   Patient presents with exam as noted above, most consistent with cellulitis.  Lab work obtained, reassuring tests.  Lactic normal, white blood cell count normal.  Vital signs reassuring.  For treatment with oral antibiotics, no indication for admission at this time, will start doxycycline here, outpatient close follow-up recommended       FINAL CLINICAL IMPRESSION(S) / ED DIAGNOSES   Final diagnoses:  Cellulitis of right upper extremity     Rx / DC Orders   ED Discharge Orders          Ordered    doxycycline (VIBRA-TABS) 100 MG tablet  2 times daily        03/26/23 1327             Note:  This document was prepared using Dragon voice recognition software and may include unintentional dictation errors.   Jene Every, MD 03/26/23 1530

## 2024-07-09 DEATH — deceased
